# Patient Record
Sex: Female | Born: 1965 | Race: White | Hispanic: No | Marital: Single | Smoking: Never smoker
Health system: Southern US, Community
[De-identification: ages and names within clinical notes are randomized; demographics above are authoritative.]

## PROBLEM LIST (undated history)

## (undated) DIAGNOSIS — M1990 Unspecified osteoarthritis, unspecified site: Secondary | ICD-10-CM

## (undated) DIAGNOSIS — F32A Depression, unspecified: Secondary | ICD-10-CM

## (undated) DIAGNOSIS — F419 Anxiety disorder, unspecified: Secondary | ICD-10-CM

## (undated) DIAGNOSIS — Z8489 Family history of other specified conditions: Secondary | ICD-10-CM

## (undated) DIAGNOSIS — T8859XA Other complications of anesthesia, initial encounter: Secondary | ICD-10-CM

## (undated) DIAGNOSIS — N809 Endometriosis, unspecified: Secondary | ICD-10-CM

## (undated) HISTORY — PX: ENDOMETRIAL ABLATION: SHX621

## (undated) HISTORY — PX: WISDOM TOOTH EXTRACTION: SHX21

---

## 2007-08-25 DIAGNOSIS — I10 Essential (primary) hypertension: Secondary | ICD-10-CM

## 2007-08-25 HISTORY — PX: CHOLECYSTECTOMY: SHX55

## 2007-08-25 HISTORY — DX: Essential (primary) hypertension: I10

## 2008-08-24 DIAGNOSIS — J189 Pneumonia, unspecified organism: Secondary | ICD-10-CM

## 2008-08-24 HISTORY — DX: Pneumonia, unspecified organism: J18.9

## 2018-08-24 HISTORY — PX: ANTERIOR CERVICAL DECOMP/DISCECTOMY FUSION: SHX1161

## 2020-07-15 ENCOUNTER — Other Ambulatory Visit: Payer: Self-pay | Admitting: Neurological Surgery

## 2020-08-06 NOTE — Progress Notes (Signed)
[facility] #[id number] Lorenza Evangelist, [place] - [address] AT [facility] [address] [place] [address] [facility] [address] Phone: [phone] Fax: [phone]      Your procedure is scheduled on Friday December 17th, 2021.  Report to [facility] Main Entrance "A" at 09:15 A.M., and check in at the Admitting office.  Call this number if you have problems the morning of surgery:  [phone]  Call [phone] if you have any questions prior to your surgery date Monday-Friday 8am-4pm    Remember:  Do not eat or drink after midnight the night before your surgery    Take these medicines the morning of surgery with A SIP OF WATER: Cetirizine (Zyrtec) escitalopram (Lexapro) Gabapentin (Neurontin) Metoprolol tartrate (Lopressor)  If needed you may take the following medications the morning of surgery: Acetaminophen (Tylenol)  As of today, STOP taking any Diclofenac (Voltaren), Aspirin (unless otherwise instructed by your surgeon) Aleve, Naproxen, Ibuprofen, Motrin, Advil, Goody's, BC's, all herbal medications, fish oil, and all vitamins.                      Do not wear jewelry, make up, or nail polish            Do not wear lotions, powders, perfumes, or deodorant.            Do not shave 48 hours prior to surgery.  Men may shave face and neck.            Do not bring valuables to the hospital.            [facility] is not responsible for any belongings or valuables.  Do NOT Smoke (Tobacco/Vaping) or drink Alcohol 24 hours prior to your procedure  If you use a CPAP at night, you may bring all equipment for your overnight stay.   Contacts, glasses, dentures or bridgework may not be worn into surgery.      For patients admitted to the hospital, discharge time will be determined by your treatment team.   Patients discharged the day of surgery will not be allowed to drive home, and someone needs to stay with them for 24 hours.    Special instructions:   [place]-  Preparing For Surgery  Before surgery, you can play an important role. Because skin is not sterile, your skin needs to be as free of germs as possible. You can reduce the number of germs on your skin by washing with CHG (chlorahexidine gluconate) Soap before surgery.  CHG is an antiseptic cleaner which kills germs and bonds with the skin to continue killing germs even after washing.    Oral Hygiene is also important to reduce your risk of infection.  Remember - BRUSH YOUR TEETH THE MORNING OF SURGERY WITH YOUR REGULAR TOOTHPASTE  Please do not use if you have an allergy to CHG or antibacterial soaps. If your skin becomes reddened/irritated stop using the CHG.  Do not shave (including legs and underarms) for at least 48 hours prior to first CHG shower. It is OK to shave your face.  Please follow these instructions carefully.   1. Shower the NIGHT BEFORE SURGERY and the MORNING OF SURGERY with CHG Soap.   2. If you chose to wash your hair, wash your hair first as usual with your normal shampoo.  3. After you shampoo, rinse your hair and body thoroughly to remove the shampoo.  4. Use CHG as you would any  other liquid soap. You can apply CHG directly to the skin and wash gently with a scrungie or a clean washcloth.   5. Apply the CHG Soap to your body ONLY FROM THE NECK DOWN.  Do not use on open wounds or open sores. Avoid contact with your eyes, ears, mouth and genitals (private parts). Wash Face and genitals (private parts)  with your normal soap.   6. Wash thoroughly, paying special attention to the area where your surgery will be performed.  7. Thoroughly rinse your body with warm water from the neck down.  8. DO NOT shower/wash with your normal soap after using and rinsing off the CHG Soap.  9. Pat yourself dry with a CLEAN TOWEL.  10. Wear CLEAN PAJAMAS to bed the night before surgery  11. Place CLEAN SHEETS on your bed the night of your first shower and DO NOT SLEEP WITH  PETS.   Day of Surgery: Shower with CHG soap as directed Wear Clean/Comfortable clothing the morning of surgery Do not apply any deodorants/lotions.   Remember to brush your teeth WITH YOUR REGULAR TOOTHPASTE.   Please read over the following fact sheets that you were given.

## 2020-08-07 ENCOUNTER — Encounter (HOSPITAL_COMMUNITY)
Admission: RE | Admit: 2020-08-07 | Discharge: 2020-08-07 | Disposition: A | Discharge status: Home / Self Care | Payer: BC Managed Care – PPO | Source: Ambulatory Visit | Attending: Neurological Surgery | Admitting: Neurological Surgery

## 2020-08-07 ENCOUNTER — Other Ambulatory Visit (HOSPITAL_COMMUNITY)
Admission: RE | Admit: 2020-08-07 | Discharge: 2020-08-07 | Disposition: A | Discharge status: Home / Self Care | Payer: BC Managed Care – PPO | Source: Ambulatory Visit | Attending: Neurological Surgery | Admitting: Neurological Surgery

## 2020-08-07 ENCOUNTER — Ambulatory Visit (HOSPITAL_COMMUNITY)
Admission: RE | Admit: 2020-08-07 | Discharge: 2020-08-07 | Disposition: A | Discharge status: Home / Self Care | Payer: BC Managed Care – PPO | Source: Ambulatory Visit | Attending: Neurological Surgery | Admitting: Neurological Surgery

## 2020-08-07 ENCOUNTER — Encounter (HOSPITAL_COMMUNITY): Payer: Self-pay

## 2020-08-07 ENCOUNTER — Other Ambulatory Visit: Payer: Self-pay

## 2020-08-07 DIAGNOSIS — M4310 Spondylolisthesis, site unspecified: Secondary | ICD-10-CM | POA: Insufficient documentation

## 2020-08-07 DIAGNOSIS — Z01812 Encounter for preprocedural laboratory examination: Secondary | ICD-10-CM | POA: Insufficient documentation

## 2020-08-07 DIAGNOSIS — Z20822 Contact with and (suspected) exposure to covid-19: Secondary | ICD-10-CM | POA: Insufficient documentation

## 2020-08-07 HISTORY — DX: Unspecified osteoarthritis, unspecified site: M19.90

## 2020-08-07 HISTORY — DX: Endometriosis, unspecified: N80.9

## 2020-08-07 HISTORY — DX: Family history of other specified conditions: Z84.89

## 2020-08-07 HISTORY — DX: Depression, unspecified: F32.A

## 2020-08-07 HISTORY — DX: Anxiety disorder, unspecified: F41.9

## 2020-08-07 HISTORY — DX: Other complications of anesthesia, initial encounter: T88.59XA

## 2020-08-07 LAB — CBC WITH DIFFERENTIAL/PLATELET
Abs Immature Granulocytes: 0.02 10*3/uL (ref 0.00–0.07)
Basophils Absolute: 0 10*3/uL (ref 0.0–0.1)
Basophils Relative: 1 %
Eosinophils Absolute: 0.1 10*3/uL (ref 0.0–0.5)
Eosinophils Relative: 2 %
HCT: 42.2 % (ref 36.0–46.0)
Hemoglobin: 14.1 g/dL (ref 12.0–15.0)
Immature Granulocytes: 0 %
Lymphocytes Relative: 31 %
Lymphs Abs: 1.9 10*3/uL (ref 0.7–4.0)
MCH: 32.4 pg (ref 26.0–34.0)
MCHC: 33.4 g/dL (ref 30.0–36.0)
MCV: 97 fL (ref 80.0–100.0)
Monocytes Absolute: 0.6 10*3/uL (ref 0.1–1.0)
Monocytes Relative: 10 %
Neutro Abs: 3.6 10*3/uL (ref 1.7–7.7)
Neutrophils Relative %: 56 %
Platelets: 232 10*3/uL (ref 150–400)
RBC: 4.35 MIL/uL (ref 3.87–5.11)
RDW: 12.9 % (ref 11.5–15.5)
WBC: 6.3 10*3/uL (ref 4.0–10.5)
nRBC: 0 % (ref 0.0–0.2)

## 2020-08-07 LAB — PROTIME-INR
INR: 1 (ref 0.8–1.2)
Prothrombin Time: 12.7 seconds (ref 11.4–15.2)

## 2020-08-07 LAB — BASIC METABOLIC PANEL
Anion gap: 10 (ref 5–15)
BUN: 20 mg/dL (ref 6–20)
CO2: 27 mmol/L (ref 22–32)
Calcium: 9.5 mg/dL (ref 8.9–10.3)
Chloride: 102 mmol/L (ref 98–111)
Creatinine, Ser: 0.73 mg/dL (ref 0.44–1.00)
GFR, Estimated: >60 mL/min (ref >60)
Glucose, Bld: 107 mg/dL — ABNORMAL HIGH (ref 70–99)
Potassium: 3.7 mmol/L (ref 3.5–5.1)
Sodium: 139 mmol/L (ref 135–145)

## 2020-08-07 LAB — TYPE AND SCREEN
ABO/RH(D): A POS
Antibody Screen: NEGATIVE

## 2020-08-07 LAB — SARS CORONAVIRUS 2 (TAT 6-24 HRS): SARS Coronavirus 2: NEGATIVE

## 2020-08-07 LAB — SURGICAL PCR SCREEN
MRSA, PCR: NEGATIVE
Staphylococcus aureus: NEGATIVE

## 2020-08-07 NOTE — Progress Notes (Addendum)
PCP - Dr. Lisabeth Pick Cardiologist - Denies  Chest x-ray - 08-07-20 EKG - 08-07-20 Stress Test - Denies ECHO - Denies Cardiac Cath - Denies  Sleep Study - Denies OSA  DM - Denies  COVID TEST- 08-07-20  Anesthesia review:Yes EKG showed frequent PVC's  Patient denies shortness of breath, fever, cough and chest pain at PAT appointment   All instructions explained to the patient, with a verbal understanding of the material. Patient agrees to go over the instructions while at home for a better understanding. Patient also instructed to self quarantine after being tested for COVID-19. The opportunity to ask questions was provided.  Surgery time [phone]. Changed arrival time to 1000am

## 2020-08-07 NOTE — Progress Notes (Addendum)
Anesthesia PAT Evaluation:  Case: [id number] Date/Time: 08/09/20 1146   Procedure: PLIF - L4-L5 - L5-S1 (N/A Back)   Anesthesia type: General   Pre-op diagnosis: Spondylolisthesis   Location: [facility] OR ROOM 20 / [facility] OR   Surgeons: Tia Alert, MD      DISCUSSION: Patient is a 54 year old female scheduled for the above procedure.   History includes never smoker, HTN, anxiety, endometriosis (s/p endometrial ablation), C4-5 ACDF (05/2019), cholecystectomy (2009). BMI is consistent with morbid obesity.  For anesthesia history, reported itching on one occasion and maternal aunt "bottomed out during anesthesia" and cholecystectomy cancelled. She said they initially thought her aunt had an MI, but reported never saw a cardiologist and went on to have the cholecystectomy at a later date. She denied family history of malignant hyperthermia.   Patient evaluated at her PAT visit due to EKG showing frequent PVCs, asymptomatic. She denied chest pain, SOB, edema, palpitations, syncope. She does get occasional brief dizziness/"wozziness" which she attributes to waves of pain--last episode on 08/06/20. She denied known personal cardiac issues. Review of available EKG interpretations in [facility] do not show history of PVCs. Her mother who was a smoker died in her sleep from an MI at the age 71. Reportedly, her mother had had a recent colonoscopy and had not felt well afterwards.  Due to her family history, her PCP ordered a Coronary Calcium Score in 2019 which was 0. Patient reports she has had progressive back to toe pain since the summer. She was able to do swimming exercises this summer and is still able to do house cleaning/vacuuming without CV symptoms even now but just in pain. She does drink coffee or espresso twice a day, but denied sodas. She reduced alcohol intake in September and now only has an occasional wine, last was > 1 month ago.  Labs unremarkable with normal CBC and BMET with non-fasting  glucose of 107.   Frequent PVCs, new on EKG and of unclear signficance. She is overall asymptomatic as outlined above. Coronary calcium score 0 in 2019. No echo. She is morbidly obese but does not appear to have any acute HF symptoms (no SOB, no edema). Discussed with anesthesiologist Kipp Brood, MD. Given history and frequent PVCs on EKG with upcoming lumbar fusion, would recommend preoperative cardiology evaluation. I have discussed this with patient and have notified Erie Noe at Dr. Yetta Barre' office. Erie Noe will follow-up with patient. Patient also to contact her PCP.   08/07/20 preoperative COVID-19 test and CXR are still in process.    ADDENDUM 08/08/20 1:38 PM: CXR report finalized. Presurgical COVID-19 test negative.  Cardiology notes indicate she has received 2 doses of mRNA vaccine for COVID-19. Patient was evaluated by cardiologist Rinaldo Cloud, MD on 08/07/20 for preoperative evaluation given history and frequent PVCs on EKG.   He wrote, "Patient is at acceptable risk for the above surgery from cardiac point of view..." He recommended checking potassium and magnesium.  Potassium was 3.7 on 08/07/20--Magnesium added and was normal at 2.0. He did not order any additional cardiac testing. She is for urine pregnancy test on the day of surgery.   VS: BP 137/85   Pulse 78   Temp 36.5 C (Oral)   Resp 18   Ht 5\' 7"  (1.702 m)   Wt (!) 152.9 kg   SpO2 97%   BMI 52.78 kg/m  Heart rhythm regular with occasional ectopic beat (about every 6-10 beasts versus the every 3-4 seen on EKG). Lungs  clear.  No lower extremity pitting edema. No carotid bruits noted. Denied limited mouth opening. No conversational dyspnea.    PROVIDERS: Lisabeth Pick, MD is PCP [facility] Everywhere). Dr. Modesto Charon is in [place]. Patient recently moved from [place] to [place].    LABS: Labs reviewed: Acceptable for surgery. (all labs ordered are listed, but only abnormal results are displayed)  Labs Reviewed   BASIC METABOLIC PANEL - Abnormal; Notable for the following components:      Result Value   Glucose, Bld 107 (*)    All other components within normal limits  SURGICAL PCR SCREEN  CBC WITH DIFFERENTIAL/PLATELET  PROTIME-INR  TYPE AND SCREEN     IMAGES: CXR 08/07/20: FINDINGS: Lungs are clear. Heart size and pulmonary vascularity are normal. No adenopathy. There is lower thoracic dextroscoliosis. There is postoperative change in the lower cervical spine. IMPRESSION: Lungs clear.  Cardiac silhouette normal.   EKG: EKG 08/07/20: Sinus rhythm with frequent Premature ventricular complexes Possible Left atrial enlargement Borderline ECG  Multiple EKG done through [facility] Per Care Everywhere:  - 10/05/19: Sinus bradycardia. No ischemic changes. Normal EKG. - 12/16/11 & 12/20/12: NSR; NORMAL EKG - Reports note available in Care Everywhere for 01/17/14, 03/12/15, 09/02/16, 12/20/17   CV: CT Cardiac Calcium Scoring 12/24/17 ([facility]): IMPRESSION:   Total coronary calcium score is 0.    INTERPRETATION:   Coronary calcifications correlate directly to the amount of coronary plaque and to the risk of future coronary disease. Early detection and modification of risk factors can slow the progression of coronary artery disease. A low score suggests a low  likelihood of coronary artery disease, but does not exclude the possibility of significant coronary artery narrowing.   CORONARY ARTERY SCORE  TOTAL 0  Plaque burden:  No calcified plaque.  Recommendations: Maintain healthy diet and healthy lifestyle.    Past Medical History:  Diagnosis Date  . Anxiety   . Arthritis    BIL knees  . Complication of anesthesia    itching on one occasion  . Depression   . Endometriosis   . Family history of adverse reaction to anesthesia    maternal aunt "bottomed out during anesthesia" - surgery cancelled   . Hypertension 2009  . Pneumonia 2010    Past Surgical History:   Procedure Laterality Date  . ANTERIOR CERVICAL DECOMP/DISCECTOMY FUSION  2020   Dr. Yetta Barre  . CHOLECYSTECTOMY  2009  . ENDOMETRIAL ABLATION    . WISDOM TOOTH EXTRACTION Bilateral    upper and lower    MEDICATIONS: . acetaminophen (TYLENOL) 500 MG tablet  . cetirizine (ZYRTEC) 10 MG tablet  . diclofenac (VOLTAREN) 75 MG EC tablet  . escitalopram (LEXAPRO) 20 MG tablet  . gabapentin (NEURONTIN) 300 MG capsule  . losartan-hydrochlorothiazide (HYZAAR) 100-25 MG tablet  . lovastatin (MEVACOR) 10 MG tablet  . metoprolol tartrate (LOPRESSOR) 100 MG tablet   No current facility-administered medications for this encounter.     Shonna Chock, PA-C Surgical Short Stay/Anesthesiology [facility] Phone [phone] [facility] Phone [phone] 08/07/2020 3:14 PM

## 2020-08-08 LAB — MAGNESIUM: Magnesium: 2 mg/dL (ref 1.7–2.4)

## 2020-08-08 MED ORDER — DEXTROSE 5 % IV SOLN
3.0000 g | INTRAVENOUS | Status: DC
  Filled 2020-08-08: qty 3000

## 2020-08-08 NOTE — Anesthesia Preprocedure Evaluation (Addendum)
Anesthesia Evaluation  Patient identified by MRN, date of birth, ID band Patient awake    Reviewed: Allergy & Precautions, NPO status , Patient's Chart, lab work & pertinent test results, reviewed documented beta blocker date and time   Airway Mallampati: I  TM Distance: >3 FB Neck ROM: Full    Dental no notable dental hx. (+) Teeth Intact, Dental Advisory Given   Pulmonary neg pulmonary ROS,    Pulmonary exam normal breath sounds clear to auscultation       Cardiovascular hypertension, Pt. on medications and Pt. on home beta blockers Normal cardiovascular exam Rhythm:Regular Rate:Normal  EKG 08/07/20: Sinus rhythm with frequent Premature ventricular complexes Possible Left atrial enlargement Borderline ECG  Multiple EKG done through [facility]. Per Care Everywhere:  - 10/05/19: Sinus bradycardia. No ischemic changes. Normal EKG. - 12/16/11 & 12/20/12: NSR; NORMAL EKG - Reports note available in Care Everywhere for 01/17/14, 03/12/15, 09/02/16, 12/20/17  CT Cardiac Calcium Scoring 12/24/17 ([facility]): IMPRESSION:   Total coronary calcium score is 0.    Neuro/Psych PSYCHIATRIC DISORDERS Anxiety Depression negative neurological ROS     GI/Hepatic negative GI ROS, Neg liver ROS,   Endo/Other  Morbid obesity (BMI 53)  Renal/GU negative Renal ROS  negative genitourinary   Musculoskeletal  (+) Arthritis , S/p ACDF   Abdominal   Peds  Hematology negative hematology ROS (+)   Anesthesia Other Findings   Reproductive/Obstetrics                           Anesthesia Physical Anesthesia Plan  ASA: III  Anesthesia Plan: General   Post-op Pain Management:    Induction: Intravenous  PONV Risk Score and Plan: 3 and Midazolam, Dexamethasone and Ondansetron  Airway Management Planned: Oral ETT  Additional Equipment:   Intra-op Plan:   Post-operative Plan: Extubation in OR  Informed  Consent: I have reviewed the patients History and Physical, chart, labs and discussed the procedure including the risks, benefits and alternatives for the proposed anesthesia with the patient or authorized representative who has indicated his/her understanding and acceptance.     Dental advisory given  Plan Discussed with: CRNA  Anesthesia Plan Comments: (.  )       Anesthesia Quick Evaluation

## 2020-08-09 ENCOUNTER — Inpatient Hospital Stay (HOSPITAL_COMMUNITY): Payer: BC Managed Care – PPO | Admitting: Vascular Surgery

## 2020-08-09 ENCOUNTER — Other Ambulatory Visit: Payer: Self-pay

## 2020-08-09 ENCOUNTER — Inpatient Hospital Stay (HOSPITAL_COMMUNITY)
Admission: RE | Admit: 2020-08-09 | Discharge: 2020-08-10 | DRG: 454 | Disposition: A | Discharge status: Home / Self Care | Payer: BC Managed Care – PPO | Attending: Neurosurgery | Admitting: Neurosurgery

## 2020-08-09 ENCOUNTER — Inpatient Hospital Stay (HOSPITAL_COMMUNITY): Payer: BC Managed Care – PPO

## 2020-08-09 ENCOUNTER — Encounter (HOSPITAL_COMMUNITY)
Admission: RE | Disposition: A | Discharge status: Home / Self Care | Payer: Self-pay | Source: Home / Self Care | Attending: Neurological Surgery

## 2020-08-09 ENCOUNTER — Encounter (HOSPITAL_COMMUNITY): Payer: Self-pay | Admitting: Neurological Surgery

## 2020-08-09 DIAGNOSIS — Z6841 Body Mass Index (BMI) 40.0 and over, adult: Secondary | ICD-10-CM

## 2020-08-09 DIAGNOSIS — Z419 Encounter for procedure for purposes other than remedying health state, unspecified: Secondary | ICD-10-CM

## 2020-08-09 DIAGNOSIS — I10 Essential (primary) hypertension: Secondary | ICD-10-CM | POA: Diagnosis present

## 2020-08-09 DIAGNOSIS — Z20822 Contact with and (suspected) exposure to covid-19: Secondary | ICD-10-CM | POA: Diagnosis present

## 2020-08-09 DIAGNOSIS — Z880 Allergy status to penicillin: Secondary | ICD-10-CM | POA: Diagnosis not present

## 2020-08-09 DIAGNOSIS — R32 Unspecified urinary incontinence: Secondary | ICD-10-CM | POA: Diagnosis present

## 2020-08-09 DIAGNOSIS — Z79899 Other long term (current) drug therapy: Secondary | ICD-10-CM | POA: Diagnosis not present

## 2020-08-09 DIAGNOSIS — M48061 Spinal stenosis, lumbar region without neurogenic claudication: Secondary | ICD-10-CM | POA: Diagnosis present

## 2020-08-09 DIAGNOSIS — Z981 Arthrodesis status: Secondary | ICD-10-CM

## 2020-08-09 DIAGNOSIS — Z9049 Acquired absence of other specified parts of digestive tract: Secondary | ICD-10-CM

## 2020-08-09 DIAGNOSIS — M4316 Spondylolisthesis, lumbar region: Secondary | ICD-10-CM | POA: Diagnosis present

## 2020-08-09 DIAGNOSIS — M5127 Other intervertebral disc displacement, lumbosacral region: Secondary | ICD-10-CM | POA: Diagnosis present

## 2020-08-09 DIAGNOSIS — Z885 Allergy status to narcotic agent status: Secondary | ICD-10-CM

## 2020-08-09 LAB — POCT PREGNANCY, URINE: Preg Test, Ur: NEGATIVE

## 2020-08-09 LAB — ABO/RH: ABO/RH(D): A POS

## 2020-08-09 SURGERY — POSTERIOR LUMBAR FUSION 2 LEVEL
Anesthesia: General | Site: Spine Lumbar

## 2020-08-09 MED ORDER — ROCURONIUM BROMIDE 10 MG/ML (PF) SYRINGE
PREFILLED_SYRINGE | INTRAVENOUS | Status: DC | PRN
  Administered 2020-08-09: 60 mg via INTRAVENOUS
  Administered 2020-08-09: 40 mg via INTRAVENOUS
  Administered 2020-08-09: 20 mg via INTRAVENOUS

## 2020-08-09 MED ORDER — ONDANSETRON HCL 4 MG/2ML IJ SOLN
INTRAMUSCULAR | Status: AC
  Filled 2020-08-09: qty 2

## 2020-08-09 MED ORDER — THROMBIN 20000 UNITS EX SOLR
CUTANEOUS | Status: AC
  Filled 2020-08-09: qty 20000

## 2020-08-09 MED ORDER — ALBUMIN HUMAN 5 % IV SOLN: INTRAVENOUS | Status: DC | PRN

## 2020-08-09 MED ORDER — EPHEDRINE SULFATE-NACL 50-0.9 MG/10ML-% IV SOSY
PREFILLED_SYRINGE | INTRAVENOUS | Status: DC | PRN
  Administered 2020-08-09: 10 mg via INTRAVENOUS
  Administered 2020-08-09 (×3): 5 mg via INTRAVENOUS

## 2020-08-09 MED ORDER — CEFAZOLIN SODIUM-DEXTROSE 1-4 GM/50ML-% IV SOLN
INTRAVENOUS | Status: AC
  Filled 2020-08-09: qty 50

## 2020-08-09 MED ORDER — THROMBIN 5000 UNITS EX SOLR
OROMUCOSAL | Status: DC | PRN
  Administered 2020-08-09: 5 mL via TOPICAL

## 2020-08-09 MED ORDER — KETAMINE HCL 50 MG/5ML IJ SOSY
PREFILLED_SYRINGE | INTRAMUSCULAR | Status: AC
  Filled 2020-08-09: qty 5

## 2020-08-09 MED ORDER — DEXAMETHASONE SODIUM PHOSPHATE 10 MG/ML IJ SOLN
10.0000 mg | Freq: Once | INTRAMUSCULAR | Status: DC
  Filled 2020-08-09: qty 1

## 2020-08-09 MED ORDER — CHLORHEXIDINE GLUCONATE 0.12 % MT SOLN
15.0000 mL | Freq: Once | OROMUCOSAL | Status: AC
  Administered 2020-08-09: 15 mL via OROMUCOSAL
  Filled 2020-08-09: qty 15

## 2020-08-09 MED ORDER — OXYCODONE HCL 5 MG PO TABS
10.0000 mg | ORAL_TABLET | ORAL | Status: DC | PRN
  Administered 2020-08-09 – 2020-08-10 (×5): 10 mg via ORAL
  Filled 2020-08-09 (×5): qty 2

## 2020-08-09 MED ORDER — HYDROMORPHONE HCL 1 MG/ML IJ SOLN: 0.5000 mg | INTRAMUSCULAR | Status: DC | PRN

## 2020-08-09 MED ORDER — KETAMINE HCL 10 MG/ML IJ SOLN
INTRAMUSCULAR | Status: DC | PRN
  Administered 2020-08-09 (×2): 25 mg via INTRAVENOUS
  Administered 2020-08-09: 20 mg via INTRAVENOUS

## 2020-08-09 MED ORDER — LOSARTAN POTASSIUM-HCTZ 100-25 MG PO TABS: 1.0000 | ORAL_TABLET | Freq: Every day | ORAL | Status: DC

## 2020-08-09 MED ORDER — ACETAMINOPHEN 500 MG PO TABS
1000.0000 mg | ORAL_TABLET | ORAL | Status: AC
  Administered 2020-08-09: 1000 mg via ORAL
  Filled 2020-08-09: qty 2

## 2020-08-09 MED ORDER — SODIUM CHLORIDE 0.9 % IV SOLN
250.0000 mL | INTRAVENOUS | Status: DC
  Administered 2020-08-09: 250 mL via INTRAVENOUS

## 2020-08-09 MED ORDER — METHOCARBAMOL 500 MG PO TABS
500.0000 mg | ORAL_TABLET | Freq: Four times a day (QID) | ORAL | Status: DC | PRN
  Administered 2020-08-09 – 2020-08-10 (×2): 500 mg via ORAL
  Filled 2020-08-09 (×2): qty 1

## 2020-08-09 MED ORDER — MIDAZOLAM HCL 5 MG/5ML IJ SOLN
INTRAMUSCULAR | Status: DC | PRN
  Administered 2020-08-09: 2 mg via INTRAVENOUS

## 2020-08-09 MED ORDER — ESCITALOPRAM OXALATE 20 MG PO TABS
20.0000 mg | ORAL_TABLET | Freq: Every day | ORAL | Status: DC
Start: 2020-08-09 — End: 2020-08-10
  Filled 2020-08-09 (×2): qty 1

## 2020-08-09 MED ORDER — ACETAMINOPHEN 650 MG RE SUPP
650.0000 mg | RECTAL | Status: DC | PRN
Start: 2020-08-09 — End: 2020-08-10

## 2020-08-09 MED ORDER — GLYCOPYRROLATE PF 0.2 MG/ML IJ SOSY
PREFILLED_SYRINGE | INTRAMUSCULAR | Status: AC
  Filled 2020-08-09: qty 1

## 2020-08-09 MED ORDER — GABAPENTIN 300 MG PO CAPS
600.0000 mg | ORAL_CAPSULE | Freq: Three times a day (TID) | ORAL | Status: DC
  Administered 2020-08-09 – 2020-08-10 (×2): 600 mg via ORAL
  Filled 2020-08-09 (×2): qty 2

## 2020-08-09 MED ORDER — LIDOCAINE 2% (20 MG/ML) 5 ML SYRINGE
INTRAMUSCULAR | Status: DC | PRN
  Administered 2020-08-09: 100 mg via INTRAVENOUS

## 2020-08-09 MED ORDER — FENTANYL CITRATE (PF) 250 MCG/5ML IJ SOLN
INTRAMUSCULAR | Status: AC
  Filled 2020-08-09: qty 5

## 2020-08-09 MED ORDER — ONDANSETRON HCL 4 MG PO TABS: 4.0000 mg | ORAL_TABLET | Freq: Four times a day (QID) | ORAL | Status: DC | PRN

## 2020-08-09 MED ORDER — GABAPENTIN 300 MG PO CAPS
300.0000 mg | ORAL_CAPSULE | ORAL | Status: DC
  Filled 2020-08-09: qty 1

## 2020-08-09 MED ORDER — CHLORHEXIDINE GLUCONATE CLOTH 2 % EX PADS: 6.0000 | MEDICATED_PAD | Freq: Once | CUTANEOUS | Status: DC

## 2020-08-09 MED ORDER — SENNA 8.6 MG PO TABS
1.0000 | ORAL_TABLET | Freq: Two times a day (BID) | ORAL | Status: DC
  Administered 2020-08-09: 8.6 mg via ORAL
  Filled 2020-08-09: qty 1

## 2020-08-09 MED ORDER — POTASSIUM CHLORIDE IN NACL 20-0.9 MEQ/L-% IV SOLN: INTRAVENOUS | Status: DC

## 2020-08-09 MED ORDER — DEXTROSE 5 % IV SOLN
INTRAVENOUS | Status: DC | PRN
  Administered 2020-08-09: 3 g via INTRAVENOUS

## 2020-08-09 MED ORDER — SODIUM CHLORIDE 0.9% FLUSH: 3.0000 mL | INTRAVENOUS | Status: DC | PRN

## 2020-08-09 MED ORDER — FENTANYL CITRATE (PF) 100 MCG/2ML IJ SOLN
INTRAMUSCULAR | Status: DC | PRN
  Administered 2020-08-09 (×2): 50 ug via INTRAVENOUS
  Administered 2020-08-09: 100 ug via INTRAVENOUS
  Administered 2020-08-09: 50 ug via INTRAVENOUS

## 2020-08-09 MED ORDER — GLYCOPYRROLATE PF 0.2 MG/ML IJ SOSY
PREFILLED_SYRINGE | INTRAMUSCULAR | Status: DC | PRN
Start: 2020-08-09 — End: 2020-08-09
  Administered 2020-08-09: .2 mg via INTRAVENOUS

## 2020-08-09 MED ORDER — SUGAMMADEX SODIUM 200 MG/2ML IV SOLN
INTRAVENOUS | Status: DC | PRN
  Administered 2020-08-09: 400 mg via INTRAVENOUS

## 2020-08-09 MED ORDER — PROPOFOL 10 MG/ML IV BOLUS
INTRAVENOUS | Status: DC | PRN
  Administered 2020-08-09: 250 mg via INTRAVENOUS

## 2020-08-09 MED ORDER — FENTANYL CITRATE (PF) 100 MCG/2ML IJ SOLN
INTRAMUSCULAR | Status: AC
  Filled 2020-08-09: qty 2

## 2020-08-09 MED ORDER — METOPROLOL TARTRATE 25 MG PO TABS
100.0000 mg | ORAL_TABLET | Freq: Every day | ORAL | Status: DC
  Administered 2020-08-09: 100 mg via ORAL
  Filled 2020-08-09: qty 4

## 2020-08-09 MED ORDER — VANCOMYCIN HCL 1000 MG IV SOLR
INTRAVENOUS | Status: AC
  Filled 2020-08-09: qty 1000

## 2020-08-09 MED ORDER — SODIUM CHLORIDE 0.9% FLUSH: 3.0000 mL | Freq: Two times a day (BID) | INTRAVENOUS | Status: DC

## 2020-08-09 MED ORDER — FENTANYL CITRATE (PF) 100 MCG/2ML IJ SOLN
25.0000 ug | INTRAMUSCULAR | Status: DC | PRN
Start: 2020-08-09 — End: 2020-08-09
  Administered 2020-08-09 (×2): 50 ug via INTRAVENOUS

## 2020-08-09 MED ORDER — BUPIVACAINE HCL (PF) 0.25 % IJ SOLN
INTRAMUSCULAR | Status: AC
  Filled 2020-08-09: qty 30

## 2020-08-09 MED ORDER — ONDANSETRON HCL 4 MG/2ML IJ SOLN
INTRAMUSCULAR | Status: DC | PRN
  Administered 2020-08-09: 4 mg via INTRAVENOUS

## 2020-08-09 MED ORDER — ROCURONIUM BROMIDE 10 MG/ML (PF) SYRINGE
PREFILLED_SYRINGE | INTRAVENOUS | Status: AC
  Filled 2020-08-09: qty 20

## 2020-08-09 MED ORDER — DEXAMETHASONE SODIUM PHOSPHATE 10 MG/ML IJ SOLN
INTRAMUSCULAR | Status: DC | PRN
  Administered 2020-08-09: 10 mg via INTRAVENOUS

## 2020-08-09 MED ORDER — LIDOCAINE 2% (20 MG/ML) 5 ML SYRINGE
INTRAMUSCULAR | Status: AC
  Filled 2020-08-09: qty 5

## 2020-08-09 MED ORDER — PHENOL 1.4 % MT LIQD: 1.0000 | OROMUCOSAL | Status: DC | PRN

## 2020-08-09 MED ORDER — PHENYLEPHRINE 40 MCG/ML (10ML) SYRINGE FOR IV PUSH (FOR BLOOD PRESSURE SUPPORT)
PREFILLED_SYRINGE | INTRAVENOUS | Status: AC
  Filled 2020-08-09: qty 10

## 2020-08-09 MED ORDER — LACTATED RINGERS IV SOLN: INTRAVENOUS | Status: DC

## 2020-08-09 MED ORDER — PHENYLEPHRINE 40 MCG/ML (10ML) SYRINGE FOR IV PUSH (FOR BLOOD PRESSURE SUPPORT)
PREFILLED_SYRINGE | INTRAVENOUS | Status: DC | PRN
  Administered 2020-08-09: 40 ug via INTRAVENOUS
  Administered 2020-08-09 (×2): 80 ug via INTRAVENOUS
  Administered 2020-08-09 (×2): 40 ug via INTRAVENOUS

## 2020-08-09 MED ORDER — CEFAZOLIN SODIUM-DEXTROSE 2-4 GM/100ML-% IV SOLN
2.0000 g | Freq: Three times a day (TID) | INTRAVENOUS | Status: AC
  Administered 2020-08-09 – 2020-08-10 (×2): 2 g via INTRAVENOUS
  Filled 2020-08-09 (×2): qty 100

## 2020-08-09 MED ORDER — THROMBIN 20000 UNITS EX SOLR
CUTANEOUS | Status: DC | PRN
  Administered 2020-08-09: 20 mL via TOPICAL

## 2020-08-09 MED ORDER — ACETAMINOPHEN 325 MG PO TABS
650.0000 mg | ORAL_TABLET | ORAL | Status: DC | PRN
  Administered 2020-08-09 – 2020-08-10 (×2): 650 mg via ORAL
  Filled 2020-08-09 (×2): qty 2

## 2020-08-09 MED ORDER — 0.9 % SODIUM CHLORIDE (POUR BTL) OPTIME
TOPICAL | Status: DC | PRN
  Administered 2020-08-09 (×2): 1000 mL

## 2020-08-09 MED ORDER — LOSARTAN POTASSIUM 50 MG PO TABS: 100.0000 mg | ORAL_TABLET | Freq: Every day | ORAL | Status: DC

## 2020-08-09 MED ORDER — EPHEDRINE 5 MG/ML INJ
INTRAVENOUS | Status: AC
  Filled 2020-08-09: qty 10

## 2020-08-09 MED ORDER — HYDROCHLOROTHIAZIDE 25 MG PO TABS: 25.0000 mg | ORAL_TABLET | Freq: Every day | ORAL | Status: DC

## 2020-08-09 MED ORDER — CEFAZOLIN SODIUM-DEXTROSE 2-4 GM/100ML-% IV SOLN
INTRAVENOUS | Status: AC
  Filled 2020-08-09: qty 100

## 2020-08-09 MED ORDER — MENTHOL 3 MG MT LOZG: 1.0000 | LOZENGE | OROMUCOSAL | Status: DC | PRN

## 2020-08-09 MED ORDER — PHENYLEPHRINE HCL-NACL 10-0.9 MG/250ML-% IV SOLN
INTRAVENOUS | Status: DC | PRN
  Administered 2020-08-09: 50 ug/min via INTRAVENOUS

## 2020-08-09 MED ORDER — CELECOXIB 200 MG PO CAPS
200.0000 mg | ORAL_CAPSULE | Freq: Two times a day (BID) | ORAL | Status: DC
  Administered 2020-08-09 – 2020-08-10 (×2): 200 mg via ORAL
  Filled 2020-08-09 (×2): qty 1

## 2020-08-09 MED ORDER — THROMBIN 5000 UNITS EX SOLR
CUTANEOUS | Status: AC
  Filled 2020-08-09: qty 5000

## 2020-08-09 MED ORDER — ORAL CARE MOUTH RINSE: 15.0000 mL | Freq: Once | OROMUCOSAL | Status: AC

## 2020-08-09 MED ORDER — MIDAZOLAM HCL 2 MG/2ML IJ SOLN
INTRAMUSCULAR | Status: AC
  Filled 2020-08-09: qty 2

## 2020-08-09 MED ORDER — BUPIVACAINE HCL (PF) 0.25 % IJ SOLN
INTRAMUSCULAR | Status: DC | PRN
  Administered 2020-08-09: 5 mL

## 2020-08-09 MED ORDER — METHOCARBAMOL 1000 MG/10ML IJ SOLN
500.0000 mg | Freq: Four times a day (QID) | INTRAVENOUS | Status: DC | PRN
  Filled 2020-08-09: qty 5

## 2020-08-09 MED ORDER — VANCOMYCIN HCL 1000 MG IV SOLR: INTRAVENOUS | Status: DC | PRN

## 2020-08-09 MED ORDER — PROPOFOL 10 MG/ML IV BOLUS
INTRAVENOUS | Status: AC
  Filled 2020-08-09: qty 20

## 2020-08-09 MED ORDER — DEXAMETHASONE SODIUM PHOSPHATE 10 MG/ML IJ SOLN
INTRAMUSCULAR | Status: AC
  Filled 2020-08-09: qty 1

## 2020-08-09 MED ORDER — ONDANSETRON HCL 4 MG/2ML IJ SOLN: 4.0000 mg | Freq: Four times a day (QID) | INTRAMUSCULAR | Status: DC | PRN

## 2020-08-09 SURGICAL SUPPLY — 73 items
BASKET BONE COLLECTION (BASKET) ×2 IMPLANT
BENZOIN TINCTURE PRP APPL 2/3 (GAUZE/BANDAGES/DRESSINGS) ×2 IMPLANT
BLADE CLIPPER SURG (BLADE) IMPLANT
BUR CARBIDE MATCH 3.0 (BURR) ×2 IMPLANT
CANISTER SUCT 3000ML PPV (MISCELLANEOUS) ×2 IMPLANT
CNTNR URN SCR LID CUP LEK RST (MISCELLANEOUS) ×1 IMPLANT
CONT SPEC 4OZ STRL OR WHT (MISCELLANEOUS) ×1
COVER BACK TABLE 60X90IN (DRAPES) ×2 IMPLANT
COVER WAND RF STERILE (DRAPES) IMPLANT
DERMABOND ADVANCED (GAUZE/BANDAGES/DRESSINGS) ×1
DERMABOND ADVANCED .7 DNX12 (GAUZE/BANDAGES/DRESSINGS) ×1 IMPLANT
DIFFUSER DRILL AIR PNEUMATIC (MISCELLANEOUS) IMPLANT
DRAPE C-ARM 42X72 X-RAY (DRAPES) ×2 IMPLANT
DRAPE C-ARMOR (DRAPES) ×2 IMPLANT
DRAPE LAPAROTOMY 100X72X124 (DRAPES) ×2 IMPLANT
DRAPE SURG 17X23 STRL (DRAPES) ×2 IMPLANT
DRSG OPSITE 4X5.5 SM (GAUZE/BANDAGES/DRESSINGS) ×2 IMPLANT
DRSG OPSITE POSTOP 4X8 (GAUZE/BANDAGES/DRESSINGS) ×2 IMPLANT
DURAPREP 26ML APPLICATOR (WOUND CARE) ×2 IMPLANT
ELECT BLADE 4.0 EZ CLEAN MEGAD (MISCELLANEOUS) ×2
ELECT REM PT RETURN 9FT ADLT (ELECTROSURGICAL) ×2
ELECTRODE BLDE 4.0 EZ CLN MEGD (MISCELLANEOUS) ×1 IMPLANT
ELECTRODE REM PT RTRN 9FT ADLT (ELECTROSURGICAL) ×1 IMPLANT
EVACUATOR 1/8 PVC DRAIN (DRAIN) ×2 IMPLANT
GAUZE 4X4 16PLY RFD (DISPOSABLE) IMPLANT
GAUZE SPONGE 4X4 12PLY STRL LF (GAUZE/BANDAGES/DRESSINGS) ×2 IMPLANT
GLOVE BIO SURGEON STRL SZ7 (GLOVE) ×4 IMPLANT
GLOVE BIO SURGEON STRL SZ8 (GLOVE) ×4 IMPLANT
GLOVE BIOGEL PI IND STRL 7.0 (GLOVE) IMPLANT
GLOVE BIOGEL PI IND STRL 7.5 (GLOVE) ×2 IMPLANT
GLOVE BIOGEL PI IND STRL 8.5 (GLOVE) ×1 IMPLANT
GLOVE BIOGEL PI INDICATOR 7.0 (GLOVE)
GLOVE BIOGEL PI INDICATOR 7.5 (GLOVE) ×2
GLOVE BIOGEL PI INDICATOR 8.5 (GLOVE) ×1
GLOVE SURG SS PI 6.0 STRL IVOR (GLOVE) ×12 IMPLANT
GLOVE SURG SS PI 6.5 STRL IVOR (GLOVE) ×4 IMPLANT
GLOVE SURG SS PI 7.0 STRL IVOR (GLOVE) ×4 IMPLANT
GLOVE SURG UNDER POLY LF SZ6.5 (GLOVE) ×6 IMPLANT
GLOVE SURG UNDER POLY LF SZ7 (GLOVE) ×6 IMPLANT
GOWN STRL REUS W/ TWL LRG LVL3 (GOWN DISPOSABLE) ×7 IMPLANT
GOWN STRL REUS W/ TWL XL LVL3 (GOWN DISPOSABLE) ×2 IMPLANT
GOWN STRL REUS W/TWL 2XL LVL3 (GOWN DISPOSABLE) IMPLANT
GOWN STRL REUS W/TWL LRG LVL3 (GOWN DISPOSABLE) ×7
GOWN STRL REUS W/TWL XL LVL3 (GOWN DISPOSABLE) ×2
HEMOSTAT POWDER KIT SURGIFOAM (HEMOSTASIS) ×2 IMPLANT
KIT BASIN OR (CUSTOM PROCEDURE TRAY) ×2 IMPLANT
KIT INFUSE X SMALL 1.4CC (Orthopedic Implant) ×2 IMPLANT
KIT TURNOVER KIT B (KITS) ×2 IMPLANT
MATRIX STRIP NEOCORE 12C (Putty) ×1 IMPLANT
MILL MEDIUM DISP (BLADE) IMPLANT
NEEDLE HYPO 25X1 1.5 SAFETY (NEEDLE) ×2 IMPLANT
NS IRRIG 1000ML POUR BTL (IV SOLUTION) ×4 IMPLANT
PACK LAMINECTOMY NEURO (CUSTOM PROCEDURE TRAY) ×2 IMPLANT
PAD ARMBOARD 7.5X6 YLW CONV (MISCELLANEOUS) ×14 IMPLANT
ROD LORD LIPPED TI 5.5X35 (Rod) ×4 IMPLANT
SCREW CANC SHANK MOD 6.5X45 (Screw) ×8 IMPLANT
SCREW POLYAXIAL TULIP (Screw) ×8 IMPLANT
SET SCREW (Screw) ×4 IMPLANT
SET SCREW SPNE (Screw) ×4 IMPLANT
SPACER IDENTITI PS 9X9X25 10D (Spacer) ×4 IMPLANT
SPONGE LAP 4X18 RFD (DISPOSABLE) IMPLANT
SPONGE SURGIFOAM ABS GEL 100 (HEMOSTASIS) ×2 IMPLANT
STRIP CLOSURE SKIN 1/2X4 (GAUZE/BANDAGES/DRESSINGS) ×2 IMPLANT
STRIP MATRIX NEOCORE 12CC (Putty) ×1 IMPLANT
SUT VIC AB 0 CT1 18XCR BRD8 (SUTURE) ×2 IMPLANT
SUT VIC AB 0 CT1 8-18 (SUTURE) ×2
SUT VIC AB 2-0 CP2 18 (SUTURE) ×2 IMPLANT
SUT VIC AB 3-0 SH 8-18 (SUTURE) ×4 IMPLANT
SYR CONTROL 10ML LL (SYRINGE) ×2 IMPLANT
TOWEL GREEN STERILE (TOWEL DISPOSABLE) ×2 IMPLANT
TOWEL GREEN STERILE FF (TOWEL DISPOSABLE) ×2 IMPLANT
TRAY FOLEY MTR SLVR 16FR STAT (SET/KITS/TRAYS/PACK) ×2 IMPLANT
WATER STERILE IRR 1000ML POUR (IV SOLUTION) ×2 IMPLANT

## 2020-08-09 NOTE — H&P (Signed)
Subjective: Patient is a 54 y.o. female admitted for plif. Onset of symptoms was several months ago, gradually worsening since that time.  The pain is rated severe, and is located at the across the lower back and radiates to legs L>R. The pain is described as aching and sharp and occurs all day. The symptoms have been progressive. Symptoms are exacerbated by exercise and standing. MRI or CT showed spondylolisthesis L4-5, HNP L5-S1 L   Past Medical History:  Diagnosis Date  . Anxiety   . Arthritis    BIL knees  . Complication of anesthesia    itching on one occasion  . Depression   . Endometriosis   . Family history of adverse reaction to anesthesia    maternal aunt "bottomed out during anesthesia" - surgery cancelled   . Hypertension 2009  . Pneumonia 2010    Past Surgical History:  Procedure Laterality Date  . ANTERIOR CERVICAL DECOMP/DISCECTOMY FUSION  2020   Dr. Yetta Barre  . CHOLECYSTECTOMY  2009  . ENDOMETRIAL ABLATION    . WISDOM TOOTH EXTRACTION Bilateral    upper and lower    Prior to Admission medications   Medication Sig Start Date End Date Taking? Authorizing Provider  acetaminophen (TYLENOL) 500 MG tablet Take 1,000 mg by mouth in the morning and at bedtime.   Yes [provider]  cetirizine (ZYRTEC) 10 MG tablet Take 10 mg by mouth daily.   Yes [provider]  diclofenac (VOLTAREN) 75 MG EC tablet Take 75 mg by mouth 2 (two) times daily.   Yes [provider]  escitalopram (LEXAPRO) 20 MG tablet Take 20 mg by mouth daily.   Yes [provider]  gabapentin (NEURONTIN) 300 MG capsule Take 600 mg by mouth 3 (three) times daily.   Yes [provider]  losartan-hydrochlorothiazide (HYZAAR) 100-25 MG tablet Take 1 tablet by mouth daily.   Yes [provider]  lovastatin (MEVACOR) 10 MG tablet Take 10 mg by mouth at bedtime.   Yes [provider]  metoprolol tartrate (LOPRESSOR) 100 MG tablet Take 100 mg by mouth  daily.   Yes [provider]   Allergies  Allergen Reactions  . Codeine Hives  . Penicillins Hives    Hives around injection site     Social History   Tobacco Use  . Smoking status: Never Smoker  . Smokeless tobacco: Never Used  Substance Use Topics  . Alcohol use: Not Currently    Comment: social drinker     History reviewed. No pertinent family history.   Review of Systems  Positive ROS: neg  All other systems have been reviewed and were otherwise negative with the exception of those mentioned in the HPI and as above.  Objective: Vital signs in last 24 hours:    General Appearance: Alert, cooperative, no distress, appears stated age Head: Normocephalic, without obvious abnormality, atraumatic Eyes: PERRL, conjunctiva/corneas clear, EOM's intact    Neck: Supple, symmetrical, trachea midline Back: Symmetric, no curvature, ROM normal, no CVA tenderness Lungs:  respirations unlabored Heart: Regular rate and rhythm Abdomen: Soft, non-tender Extremities: Extremities normal, atraumatic, no cyanosis or edema Pulses: 2+ and symmetric all extremities Skin: Skin color, texture, turgor normal, no rashes or lesions  NEUROLOGIC:   Mental status: Alert and oriented x4,  no aphasia, good attention span, fund of knowledge, and memory Motor Exam - grossly normal Sensory Exam - grossly normal Reflexes: 1+ Coordination - grossly normal Gait - grossly normal Balance - grossly normal Cranial Nerves:  I: smell Not tested  II: visual acuity  OS: nl    OD: nl  II: visual fields Full to confrontation  II: pupils Equal, round, reactive to light  III,VII: ptosis None  III,IV,VI: extraocular muscles  Full ROM  V: mastication Normal  V: facial light touch sensation  Normal  V,VII: corneal reflex  Present  VII: facial muscle function - upper  Normal  VII: facial muscle function - lower Normal  VIII: hearing Not tested  IX: soft palate elevation  Normal  IX,X: gag reflex  Present  XI: trapezius strength  5/5  XI: sternocleidomastoid strength 5/5  XI: neck flexion strength  5/5  XII: tongue strength  Normal    Data Review Lab Results  Component Value Date   WBC 6.3 08/07/2020   HGB 14.1 08/07/2020   HCT 42.2 08/07/2020   MCV 97.0 08/07/2020   PLT 232 08/07/2020   Lab Results  Component Value Date   NA 139 08/07/2020   K 3.7 08/07/2020   CL 102 08/07/2020   CO2 27 08/07/2020   BUN 20 08/07/2020   CREATININE 0.73 08/07/2020   GLUCOSE 107 (H) 08/07/2020   Lab Results  Component Value Date   INR 1.0 08/07/2020    Assessment/Plan:  Estimated body mass index is 52.78 kg/m as calculated from the following:   Height as of 08/07/20: 5\' 7"  (1.702 m).   Weight as of 08/07/20: 152.9 kg. Patient admitted for PLIF L4-5, PLIF vs L microdisk L5-S1. Patient has failed a reasonable attempt at conservative therapy.  I explained the condition and procedure to the patient and answered any questions.  Patient wishes to proceed with procedure as planned. Understands risks/ benefits and typical outcomes of procedure.   08/09/20 08/09/2020 10:33 AM

## 2020-08-09 NOTE — Op Note (Signed)
08/09/2020  2:59 PM  PATIENT:  Alejandra White  54 y.o. female  PRE-OPERATIVE DIAGNOSIS: Dynamic degenerative spondylolisthesis with spinal stenosis L4-5, left L5-S1 herniated disc, back and leg pain  POST-OPERATIVE DIAGNOSIS:  same  PROCEDURE:   1. Decompressive lumbar laminectomy hemifacetectomy foraminotomies L4-5 requiring more work than would be required for a simple exposure of the disk for PLIF in order to adequately decompress the neural elements and address the spinal stenosis 2. Posterior lumbar interbody fusion L4-5 using PTI interbody cages packed with morcellized allograft and autograft  3. Posterior fixation L4-5 using Alphatec cortical pedicle screws.  4. Intertransverse arthrodesis L4-5 using morcellized autograft and allograft. 5.  Left L5-S1 hemilaminectomy medial facetectomy and foraminotomies for lateral recess decompression followed by discectomy  SURGEON:  Marikay Alar, MD  ASSISTANTS: Dr. Danielle Dess  ANESTHESIA:  General  EBL: 300 ml  Total I/O In: 1250 [I.V.:1000; IV Piggyback:250] Out: 600 [Urine:300; Blood:300]  BLOOD ADMINISTERED:none  DRAINS: none   INDICATION FOR PROCEDURE: This patient presented with severe back and leg pain with urinary incontinence. Imaging revealed difficult dynamic spondylolisthesis with stenosis L4-5 and a left L5-S1 disc herniation. The patient tried a reasonable attempt at conservative medical measures without relief. I recommended decompression and instrumented fusion to address the stenosis as well as the segmental  instability.  Patient understood the risks, benefits, and alternatives and potential outcomes and wished to proceed.  Findings at surgery: Her morbid obesity made the surgery much more difficult than is typical for this surgery.  Retraction of the anatomy was much more difficult and the retractors were of  inadequate size, much more work time was required to fully identify the anatomical elements and the correct  level, the depth made the technical aspects of the decompression and discectomy much more difficult and time-consuming.  PROCEDURE DETAILS:  The patient was brought to the operating room. After induction of generalized endotracheal anesthesia the patient was rolled into the prone position on chest rolls and all pressure points were padded. The patient's lumbar region was cleaned and then prepped with DuraPrep and draped in the usual sterile fashion. Anesthesia was injected and then a dorsal midline incision was made and carried down to the lumbosacral fascia.  The fascia was about 80 to 90 cm deep and retractors were placed to expose the fascia.  The fascia was opened and the paraspinous musculature was taken down in a subperiosteal fashion to expose L4-5 and L5-S1. A self-retaining retractor was placed.  Firming our level was quite difficult because it was difficult to get adequate fluoroscopic pictures but we used AP and lateral fluoroscopy and all in the room agreed that we were at L4-5 and L5-S1 throughout the case.  We confirmed this multiple times.  Intraoperative fluoroscopy confirmed my level, and I started the left L5-S1 hemilaminectomy medial facetectomy and foraminotomies.  The yellow ligament was opened and removed to expose the underlying dura and exiting S1 nerve root.  I dissected down to his medial to the pedicle.  Identified the disc and there was a subannular protrusion.  I incised the annulus and performed a thorough discectomy.  When I was done the annulus was flat.  The nerve appeared to be free.  We decided against fusion at this level because of the technical difficulties of the surgery.  We thought this would increase the risk of not only the technical aspects of the decompression and fusion but the risk of pseudoarthrosis.  Therefore we turned our attention to the placement of  the L4 cortical pedicle screws. The pedicle screw entry zones were identified utilizing surface landmarks and  AP  and lateral fluoroscopy. I scored the cortex with the high-speed drill and then used the hand drill to drill an upward and outward direction into the pedicle. I then tapped line to line. I then placed a 6.5 x 45 mm cortical pedicle screw into the pedicles of L4 bilaterally.  Dr. Danielle Dess assisted with the placement of the pedicle screws and the localization of the levels.  I then turned my attention to the decompression and complete lumbar laminectomies, hemi- facetectomies, and foraminotomies were performed at L4-5 bilaterally.  My nurse practitioner was directly involved in the decompression and exposure of the neural elements. the patient had significant spinal stenosis and this required more work than would be required for a simple exposure of the disc for posterior lumbar interbody fusion which would only require a limited laminotomy. Much more generous decompression and generous foraminotomy was undertaken in order to adequately decompress the neural elements and address the patient's leg pain. The yellow ligament was removed to expose the underlying dura and nerve roots, and generous foraminotomies were performed to adequately decompress the neural elements. Both the exiting and traversing nerve roots were decompressed on both sides until a coronary dilator passed easily along the nerve roots. Once the decompression was complete, I turned my attention to the posterior lower lumbar interbody fusion. The epidural venous vasculature was coagulated and cut sharply. Disc space was incised and the initial discectomy was performed with pituitary rongeurs. The disc space was distracted with sequential distractors to a height of 9 mm. We then used a series of scrapers and shavers to prepare the endplates for fusion. The midline was prepared with Epstein curettes. Once the complete discectomy was finished, we packed an appropriate sized interbody cage with local autograft and morcellized allograft, gently retracted the  nerve root, and tapped the cage into position at L4-5.  The midline between the cages was packed with morselized autograft and allograft. We then turned our attention to the placement of the lower pedicle screws. The pedicle screw entry zones were identified utilizing surface landmarks and fluoroscopy. I drilled into each pedicle utilizing the hand drill, and tapped each pedicle with the appropriate tap. We palpated with a ball probe to assure no break in the cortex. We then placed 6.5 x 45 mm pedicle screws into the pedicles bilaterally at L5.  My nurse practitioner assisted in placement of the pedicle screws.  We then decorticated the transverse processes and laid a mixture of morcellized autograft and allograft out over these to perform intertransverse arthrodesis at L4-5. We then placed lordotic rods into the multiaxial screw heads of the pedicle screws and locked these in position with the locking caps and anti-torque device. We then checked our construct with AP and lateral fluoroscopy. Irrigated with copious amounts of bacitracin-containing saline solution. Inspected the nerve roots once again to assure adequate decompression, lined to the dura with Gelfoam,  and then we closed the muscle and the fascia with 0 Vicryl. Closed the subcutaneous tissues with 2-0 Vicryl and subcuticular tissues with 3-0 Vicryl. The skin was closed with benzoin and Steri-Strips. Dressing was then applied, the patient was awakened from general anesthesia and transported to the recovery room in stable condition. At the end of the procedure all sponge, needle and instrument counts were correct.   PLAN OF CARE: admit to inpatient  PATIENT DISPOSITION:  PACU - hemodynamically stable.   Delay  start of Pharmacological VTE agent (>24hrs) due to surgical blood loss or risk of bleeding:  yes

## 2020-08-09 NOTE — Anesthesia Procedure Notes (Signed)
Procedure Name: Intubation Date/Time: 08/09/2020 11:16 AM Performed by: Janene Harvey, CRNA Pre-anesthesia Checklist: Patient identified, Emergency Drugs available, Suction available and Patient being monitored Patient Re-evaluated:Patient Re-evaluated prior to induction Oxygen Delivery Method: Circle system utilized Preoxygenation: Pre-oxygenation with 100% oxygen Induction Type: IV induction Ventilation: Mask ventilation without difficulty Laryngoscope Size: Mac and 4 Grade View: Grade I Tube type: Oral Tube size: 7.5 mm Number of attempts: 1 Airway Equipment and Method: Stylet and Oral airway Placement Confirmation: ETT inserted through vocal cords under direct vision,  positive ETCO2 and breath sounds checked- equal and bilateral Secured at: 22 cm Tube secured with: Tape Dental Injury: Teeth and Oropharynx as per pre-operative assessment

## 2020-08-09 NOTE — Transfer of Care (Signed)
Immediate Anesthesia Transfer of Care Note  Patient: Alejandra White  Procedure(s) Performed: POSTERIOR LUMBAR INTERBODY FUSION LUMBAR FOUR-FIVE; LEFT LUMBAR FIVE-SACARAL ONE MICRODISCECTOMY (N/A Spine Lumbar)  Patient Location: PACU  Anesthesia Type:General  Level of Consciousness: awake, alert , oriented and patient cooperative  Airway & Oxygen Therapy: Patient Spontanous Breathing and Patient connected to face mask oxygen  Post-op Assessment: Report given to RN and Post -op Vital signs reviewed and stable  Post vital signs: Reviewed and stable  Last Vitals:  Vitals Value Taken Time  BP 111/58 08/09/20 1509  Temp    Pulse 84 08/09/20 1512  Resp 17 08/09/20 1512  SpO2 98 % 08/09/20 1512  Vitals shown include unvalidated device data.  Last Pain:  Vitals:   08/09/20 1055  TempSrc: Oral  PainSc:       Patients Stated Pain Goal: 2 (08/09/20 1010)  Complications: No complications documented.

## 2020-08-10 MED ORDER — METHOCARBAMOL 500 MG PO TABS: 500.0000 mg | ORAL_TABLET | Freq: Four times a day (QID) | ORAL | 1 refills | Status: AC | PRN

## 2020-08-10 MED ORDER — OXYCODONE HCL 10 MG PO TABS
10.0000 mg | ORAL_TABLET | ORAL | 0 refills | Status: AC | PRN
Start: 2020-08-10 — End: ?

## 2020-08-10 NOTE — Care Management (Signed)
No HH needs, DME to be provided by unit staff.

## 2020-08-10 NOTE — Discharge Summary (Signed)
Physician Discharge Summary  Patient ID: Alejandra White MRN: [id number] DOB/AGE: 1966-01-16 54 y.o.  Admit date: 08/09/2020 Discharge date: 08/10/2020  Admission Diagnoses:  Discharge Diagnoses:  Active Problems:   S/P lumbar fusion   Discharged Condition: good  Hospital Course: Patient mated to the hospital where she underwent uncomplicated lumbar decompression fusion.  Postop Nedra Hai doing very well.  Preoperative back and lower extremity pain much improved.  Standing ambulating and voiding without difficulty.  Ready for discharge home.  Consults:   Significant Diagnostic Studies:   Treatments:   Discharge Exam: Blood pressure (!) 97/55, pulse 62, temperature (!) 97.5 F (36.4 C), temperature source Oral, resp. rate 17, height 5\' 8"  (1.727 m), weight (!) 152.4 kg, SpO2 97 %. Awake and alert.  Oriented and appropriate.  Motor and sensory function intact.  Wound clean and dry.  Chest and abdomen benign.  Disposition: Discharge disposition: 01-Home or Self Care        Allergies as of 08/10/2020      Reactions   Codeine Hives   Penicillins Hives   Hives around injection site       Medication List    TAKE these medications   acetaminophen 500 MG tablet Commonly known as: TYLENOL Take 1,000 mg by mouth in the morning and at bedtime.   cetirizine 10 MG tablet Commonly known as: ZYRTEC Take 10 mg by mouth daily.   diclofenac 75 MG EC tablet Commonly known as: VOLTAREN Take 75 mg by mouth 2 (two) times daily.   escitalopram 20 MG tablet Commonly known as: LEXAPRO Take 20 mg by mouth daily.   gabapentin 300 MG capsule Commonly known as: NEURONTIN Take 600 mg by mouth 3 (three) times daily.   losartan-hydrochlorothiazide 100-25 MG tablet Commonly known as: HYZAAR Take 1 tablet by mouth daily.   lovastatin 10 MG tablet Commonly known as: MEVACOR Take 10 mg by mouth at bedtime.   methocarbamol 500 MG tablet Commonly known as: ROBAXIN Take 1 tablet  (500 mg total) by mouth every 6 (six) hours as needed for muscle spasms.   metoprolol tartrate 100 MG tablet Commonly known as: LOPRESSOR Take 100 mg by mouth daily.   Oxycodone HCl 10 MG Tabs Take 1 tablet (10 mg total) by mouth every 3 (three) hours as needed for severe pain ((score 7 to 10)).            Durable Medical Equipment  (From admission, onward)         Start     Ordered   08/09/20 1721  DME Walker rolling  Once       Question:  Patient needs a walker to treat with the following condition  Answer:  S/P lumbar fusion   08/09/20 1720   08/09/20 1721  DME 3 n 1  Once        08/09/20 1720           Signed: 08/11/20 Wrenn Willcox 08/10/2020, 9:52 AM

## 2020-08-10 NOTE — Progress Notes (Signed)
Patient alert and oriented, mae's well, voiding adequate amount of urine, swallowing without difficulty, no c/o pain at time of discharge. Patient discharged home with family. Script and discharged instructions given to patient. Patient and family stated understanding of instructions given. Patient has an appointment with Dr. Jones °

## 2020-08-10 NOTE — Discharge Instructions (Addendum)

## 2020-08-10 NOTE — Evaluation (Signed)
Occupational Therapy Evaluation Patient Details Name: Alejandra White MRN: [id number] DOB: 10/03/1965 Today's Date: 08/10/2020    History of Present Illness Pt is a 54 y.o. F with significant PMH of prior ACDF who presents with dynamic degenerative spondylolisthesis with spinal stenosis L4-5 who is now s/p L4-5 PLIF, left L5-S1 hemilaminectomy, medial facetctomy, and foraminotomies.   Clinical Impression   Pt PTA: Pt living at home with significant other and reports nearly independent with ADL and mobility. Pt currently, able to perform all ADL tasks with increased time. Pt donning pants without bending, but unable to perform figure 4 cross over leg technique so pt education on a reacher to assist with LB ADL. Pt education on toilet tongs for hygiene. Pt reports to have supportive family to assist as needed.  Back handout provided and reviewed ADL in detail. Pt educated on: clothing between brace, never sleep in brace, set an alarm at night for medication, avoid sitting for long periods of time, correct bed positioning for sleeping, correct sequence for bed mobility, avoiding lifting more than 5 pounds and never wash directly over incision. All education is complete and patient indicates understanding. No further acute OT services required. OT signing off.      Follow Up Recommendations  No OT follow up    Equipment Recommendations  None recommended by OT    Recommendations for Other Services       Precautions / Restrictions Precautions Precautions: Back Precaution Booklet Issued: Yes (comment) Precaution Comments: Pt recalling 3/3 back precautions after PT morning session. Required Braces or Orthoses: Spinal Brace Spinal Brace: Lumbar corset;Applied in sitting position Restrictions Weight Bearing Restrictions: No      Mobility Bed Mobility               General bed mobility comments: OOB in chair    Transfers Overall transfer level: Modified independent                General transfer comment: Increased time/effort    Balance Overall balance assessment: No apparent balance deficits (not formally assessed)                                         ADL either performed or assessed with clinical judgement   ADL Overall ADL's : Modified independent;At baseline                                       General ADL Comments: Pt able to perform all ADL tasks with increased time. Pt donning pants without bending, but unable to perform figure 4 cross over leg technique so pt education on a reacher to assist with LB ADL. Pt education on toilet tongs for hygiene. Pt reports to have supportive family to assist as needed.     Vision Baseline Vision/History: Wears glasses Patient Visual Report: No change from baseline Vision Assessment?: No apparent visual deficits     Perception     Praxis      Pertinent Vitals/Pain Pain Assessment: Faces Faces Pain Scale: Hurts a little bit Pain Location: surgical site Pain Descriptors / Indicators: Operative site guarding Pain Intervention(s): Monitored during session;Premedicated before session;Repositioned     Hand Dominance Right   Extremity/Trunk Assessment Upper Extremity Assessment Upper Extremity Assessment: Overall WFL for tasks assessed;RUE deficits/detail;LUE deficits/detail RUE Deficits /  Details: 5/5 MM grade LUE Deficits / Details: 5/5 MM grade   Lower Extremity Assessment Lower Extremity Assessment: Overall WFL for tasks assessed;Defer to PT evaluation RLE Deficits / Details: Strength 5/5 LLE Deficits / Details: Strength 5/5   Cervical / Trunk Assessment Cervical / Trunk Assessment: Other exceptions Cervical / Trunk Exceptions: s/p PLIF   Communication Communication Communication: No difficulties   Cognition Arousal/Alertness: Awake/alert Behavior During Therapy: WFL for tasks assessed/performed Overall Cognitive Status: Within Functional Limits for  tasks assessed                                     General Comments       Exercises     Shoulder Instructions      Home Living Family/patient expects to be discharged to:: Private residence Living Arrangements: Spouse/significant other Available Help at Discharge: Family (son, sister) Type of Home: House Home Access: Stairs to enter Secretary/administrator of Steps: 3 Entrance Stairs-Rails: None Home Layout: Multi-level Alternate Level Stairs-Number of Steps:  (flight) Alternate Level Stairs-Rails: Right;Left Bathroom Shower/Tub: Producer, television/film/video: Standard     Home Equipment: Toilet riser;Shower seat          Prior Functioning/Environment Level of Independence: Independent        Comments: Works in Warehouse manager work        OT Problem List:        OT Treatment/Interventions:      OT Goals(Current goals can be found in the care plan section) Acute Rehab OT Goals Patient Stated Goal: return to work OT Goal Formulation: All assessment and education complete, DC therapy  OT Frequency:     Barriers to D/C:            Co-evaluation              AM-PAC OT "6 Clicks" Daily Activity     Outcome Measure Help from another person eating meals?: None Help from another person taking care of personal grooming?: None Help from another person toileting, which includes using toliet, bedpan, or urinal?: None Help from another person bathing (including washing, rinsing, drying)?: A Little Help from another person to put on and taking off regular upper body clothing?: None Help from another person to put on and taking off regular lower body clothing?: A Little 6 Click Score: 22   End of Session Equipment Utilized During Treatment: Back brace Nurse Communication: Mobility status  Activity Tolerance: Patient tolerated treatment well Patient left: in chair;with call bell/phone within reach  OT Visit Diagnosis: Unsteadiness on feet  (R26.81);Pain Pain - part of body:  (back)                Time: 0812-0828 OT Time Calculation (min): 16 min Charges:  OT General Charges $OT Visit: 1 Visit OT Evaluation $OT Eval Moderate Complexity: 1 Mod  Flora Lipps, OTR/L Acute Rehabilitation Services Pager: [phone] Office: [phone]   Devanie Galanti C 08/10/2020, 9:32 AM

## 2020-08-10 NOTE — Evaluation (Signed)
Physical Therapy Evaluation and Discharge Patient Details Name: Alejandra White MRN: [id number] DOB: Jul 15, 1966 Today's Date: 08/10/2020   History of Present Illness  Pt is a 54 y.o. F with significant PMH of prior ACDF who presents with dynamic degenerative spondylolisthesis with spinal stenosis L4-5 who is now s/p L4-5 PLIF, left L5-S1 hemilaminectomy, medial facetctomy, and foraminotomies.  Clinical Impression  Patient evaluated by Physical Therapy with no further acute PT needs identified. Prior to admission, pt lives with her significant other and is employed in clerical work. On PT evaluation, pt denies radicular symptoms and presents with good pain control. Ambulating 400 feet with no assistive device and negotiated 10 steps without physical assist. Education provided regarding spinal precautions, generalized walking program, car transfer technique. All education has been completed and the patient has no further questions. No follow-up Physical Therapy or equipment needs. PT is signing off. Thank you for this referral.     Follow Up Recommendations No PT follow up    Equipment Recommendations  None recommended by PT    Recommendations for Other Services       Precautions / Restrictions Precautions Precautions: Back Precaution Booklet Issued: Yes (comment) Precaution Comments: Pt recalling 2/3, written handout provided Required Braces or Orthoses: Spinal Brace Spinal Brace: Lumbar corset;Applied in sitting position Restrictions Weight Bearing Restrictions: No      Mobility  Bed Mobility               General bed mobility comments: OOB in chair    Transfers Overall transfer level: Modified independent               General transfer comment: Increased time/effort  Ambulation/Gait Ambulation/Gait assistance: Modified independent (Device/Increase time) Gait Distance (Feet): 400 Feet Assistive device: None Gait Pattern/deviations: Step-through  pattern;Wide base of support     General Gait Details: No gross instability noted, good posture  Stairs Stairs: Yes Stairs assistance: Min guard Stair Management: One rail Right Number of Stairs: 10 General stair comments: Cues for step by step pattern  Wheelchair Mobility    Modified Rankin (Stroke Patients Only)       Balance Overall balance assessment: No apparent balance deficits (not formally assessed)                                           Pertinent Vitals/Pain Pain Assessment: Faces Faces Pain Scale: Hurts a little bit Pain Location: surgical site Pain Descriptors / Indicators: Operative site guarding Pain Intervention(s): Monitored during session    Home Living Family/patient expects to be discharged to:: Private residence Living Arrangements: Spouse/significant other Available Help at Discharge: Family (son, sister) Type of Home: House Home Access: Stairs to enter Entrance Stairs-Rails: None Entrance Stairs-Number of Steps: 3 Home Layout: Multi-level Home Equipment: Toilet riser;Shower seat      Prior Function Level of Independence: Independent         Comments: Works in Warehouse manager work     Higher education careers adviser        Extremity/Trunk Assessment   Upper Extremity Assessment Upper Extremity Assessment: Defer to OT evaluation    Lower Extremity Assessment Lower Extremity Assessment: RLE deficits/detail;LLE deficits/detail RLE Deficits / Details: Strength 5/5 LLE Deficits / Details: Strength 5/5    Cervical / Trunk Assessment Cervical / Trunk Assessment: Other exceptions Cervical / Trunk Exceptions: s/p PLIF  Communication   Communication: No difficulties  Cognition Arousal/Alertness:  Awake/alert Behavior During Therapy: WFL for tasks assessed/performed Overall Cognitive Status: Within Functional Limits for tasks assessed                                        General Comments      Exercises      Assessment/Plan    PT Assessment Patent does not need any further PT services  PT Problem List         PT Treatment Interventions      PT Goals (Current goals can be found in the Care Plan section)  Acute Rehab PT Goals Patient Stated Goal: return to work PT Goal Formulation: All assessment and education complete, DC therapy    Frequency     Barriers to discharge        Co-evaluation               AM-PAC PT "6 Clicks" Mobility  Outcome Measure Help needed turning from your back to your side while in a flat bed without using bedrails?: None Help needed moving from lying on your back to sitting on the side of a flat bed without using bedrails?: None Help needed moving to and from a bed to a chair (including a wheelchair)?: None Help needed standing up from a chair using your arms (e.g., wheelchair or bedside chair)?: None Help needed to walk in hospital room?: None Help needed climbing 3-5 steps with a railing? : None 6 Click Score: 24    End of Session   Activity Tolerance: Patient tolerated treatment well Patient left: with call bell/phone within reach;in chair Nurse Communication: Mobility status PT Visit Diagnosis: Pain Pain - part of body:  (back)    Time: 0240-9735 PT Time Calculation (min) (ACUTE ONLY): 18 min   Charges:   PT Evaluation $PT Eval Low Complexity: 1 Low          Alejandra White, PT, DPT Acute Rehabilitation Services Pager [phone] Office [phone]   Alejandra White 08/10/2020, 9:16 AM

## 2020-08-12 NOTE — Anesthesia Postprocedure Evaluation (Signed)
Anesthesia Post Note  Patient: Alejandra White  Procedure(s) Performed: POSTERIOR LUMBAR INTERBODY FUSION LUMBAR FOUR-FIVE; LEFT LUMBAR FIVE-SACARAL ONE MICRODISCECTOMY (N/A Spine Lumbar)     Patient location during evaluation: PACU Anesthesia Type: General Level of consciousness: awake and alert Pain management: pain level controlled Vital Signs Assessment: post-procedure vital signs reviewed and stable Respiratory status: spontaneous breathing, nonlabored ventilation, respiratory function stable and patient connected to nasal cannula oxygen Cardiovascular status: blood pressure returned to baseline and stable Postop Assessment: no apparent nausea or vomiting Anesthetic complications: no   No complications documented.  Last Vitals:  Vitals:   08/10/20 0505 08/10/20 0732  BP: 98/67 (!) 97/55  Pulse: 71 62  Resp: 20 17  Temp: 36.7 C (!) 36.4 C  SpO2: 96% 97%    Last Pain:  Vitals:   08/10/20 0732  TempSrc: Oral  PainSc:                  Lauro Manlove L Shambhavi Salley

## 2021-11-12 IMAGING — CR DG CHEST 2V
2 series · 2 of 2 positions shown · non-contrast
Comparison: None.

CLINICAL DATA: Preoperative assessment for lumbar surgery

EXAM:
CHEST - 2 VIEW

[w chest pa]
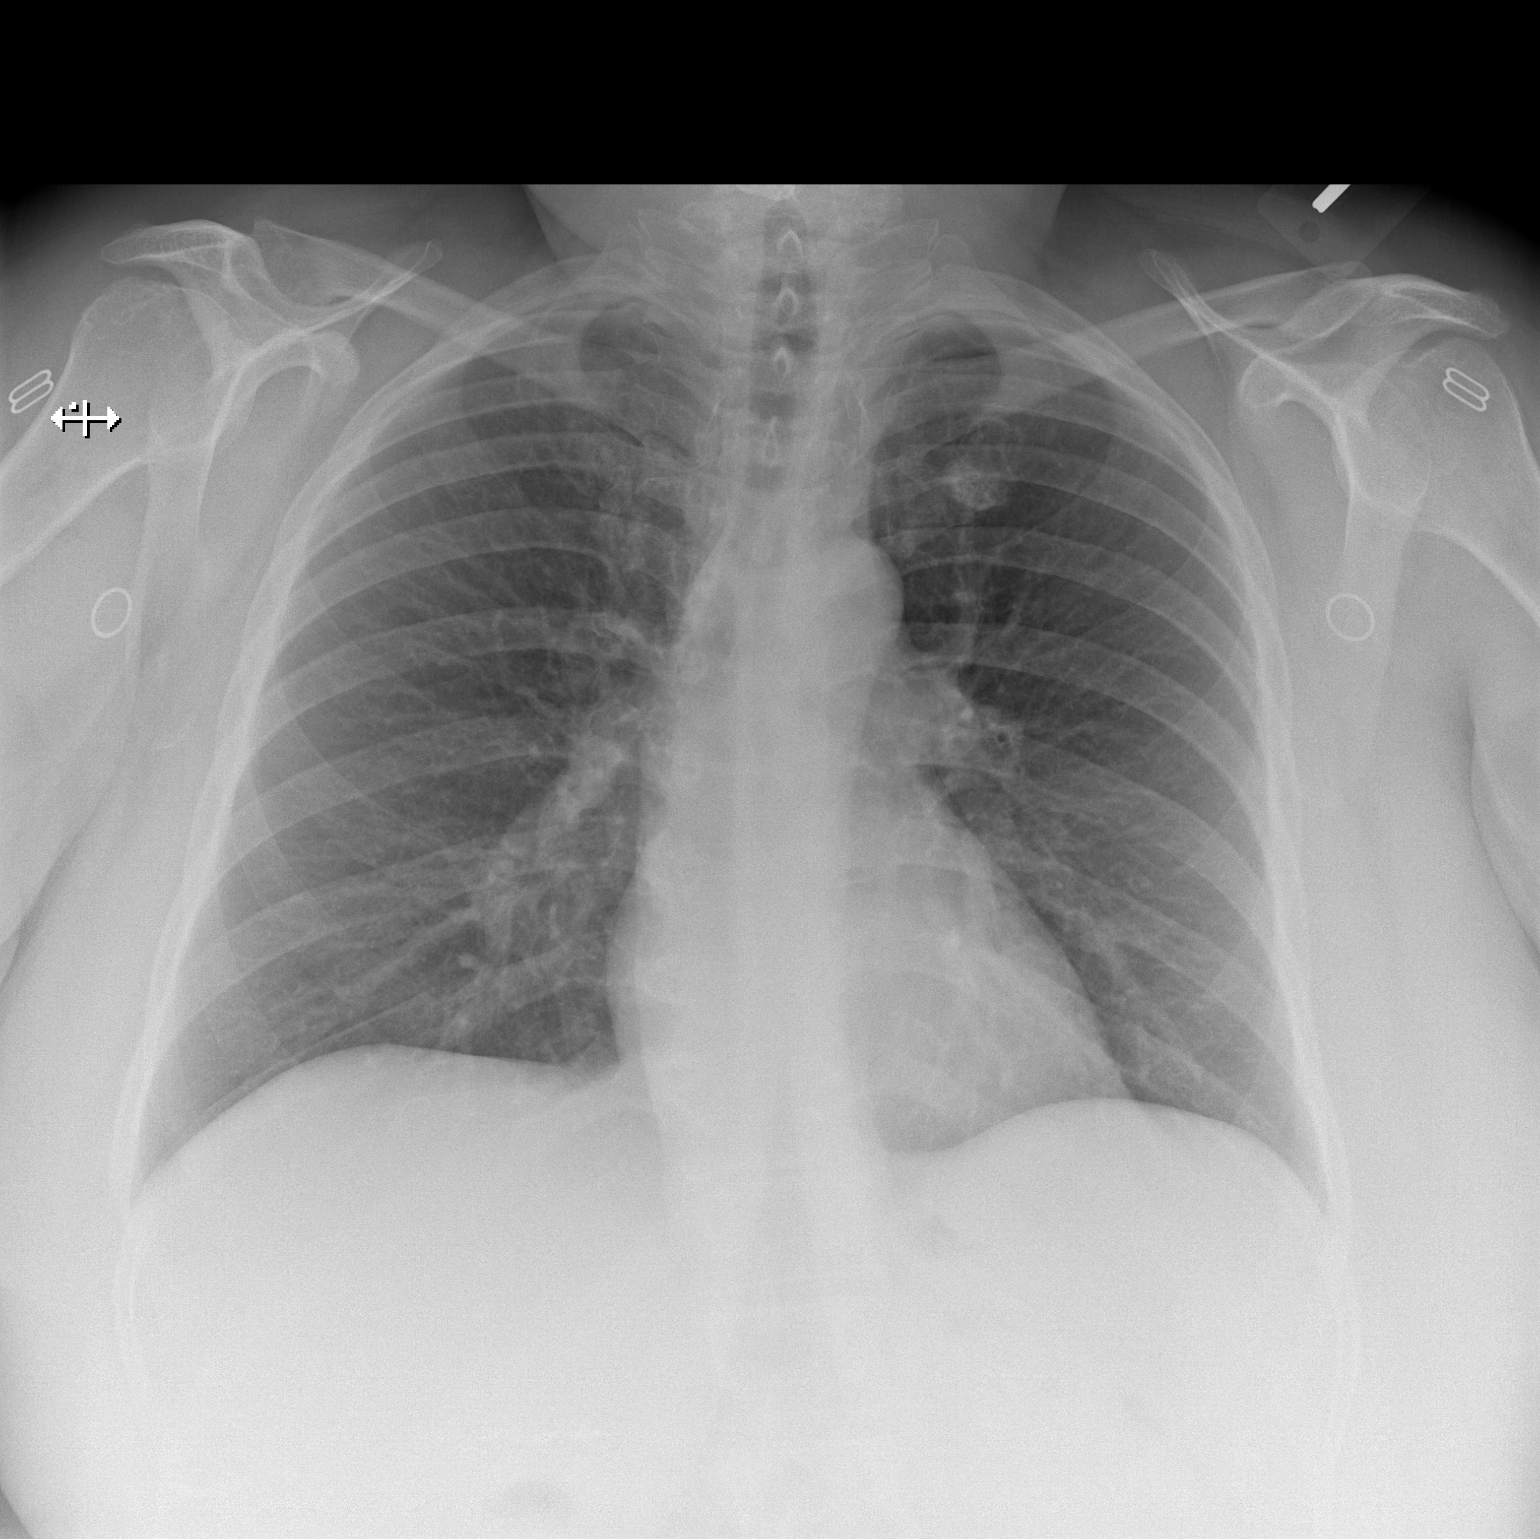

[w chest lat]
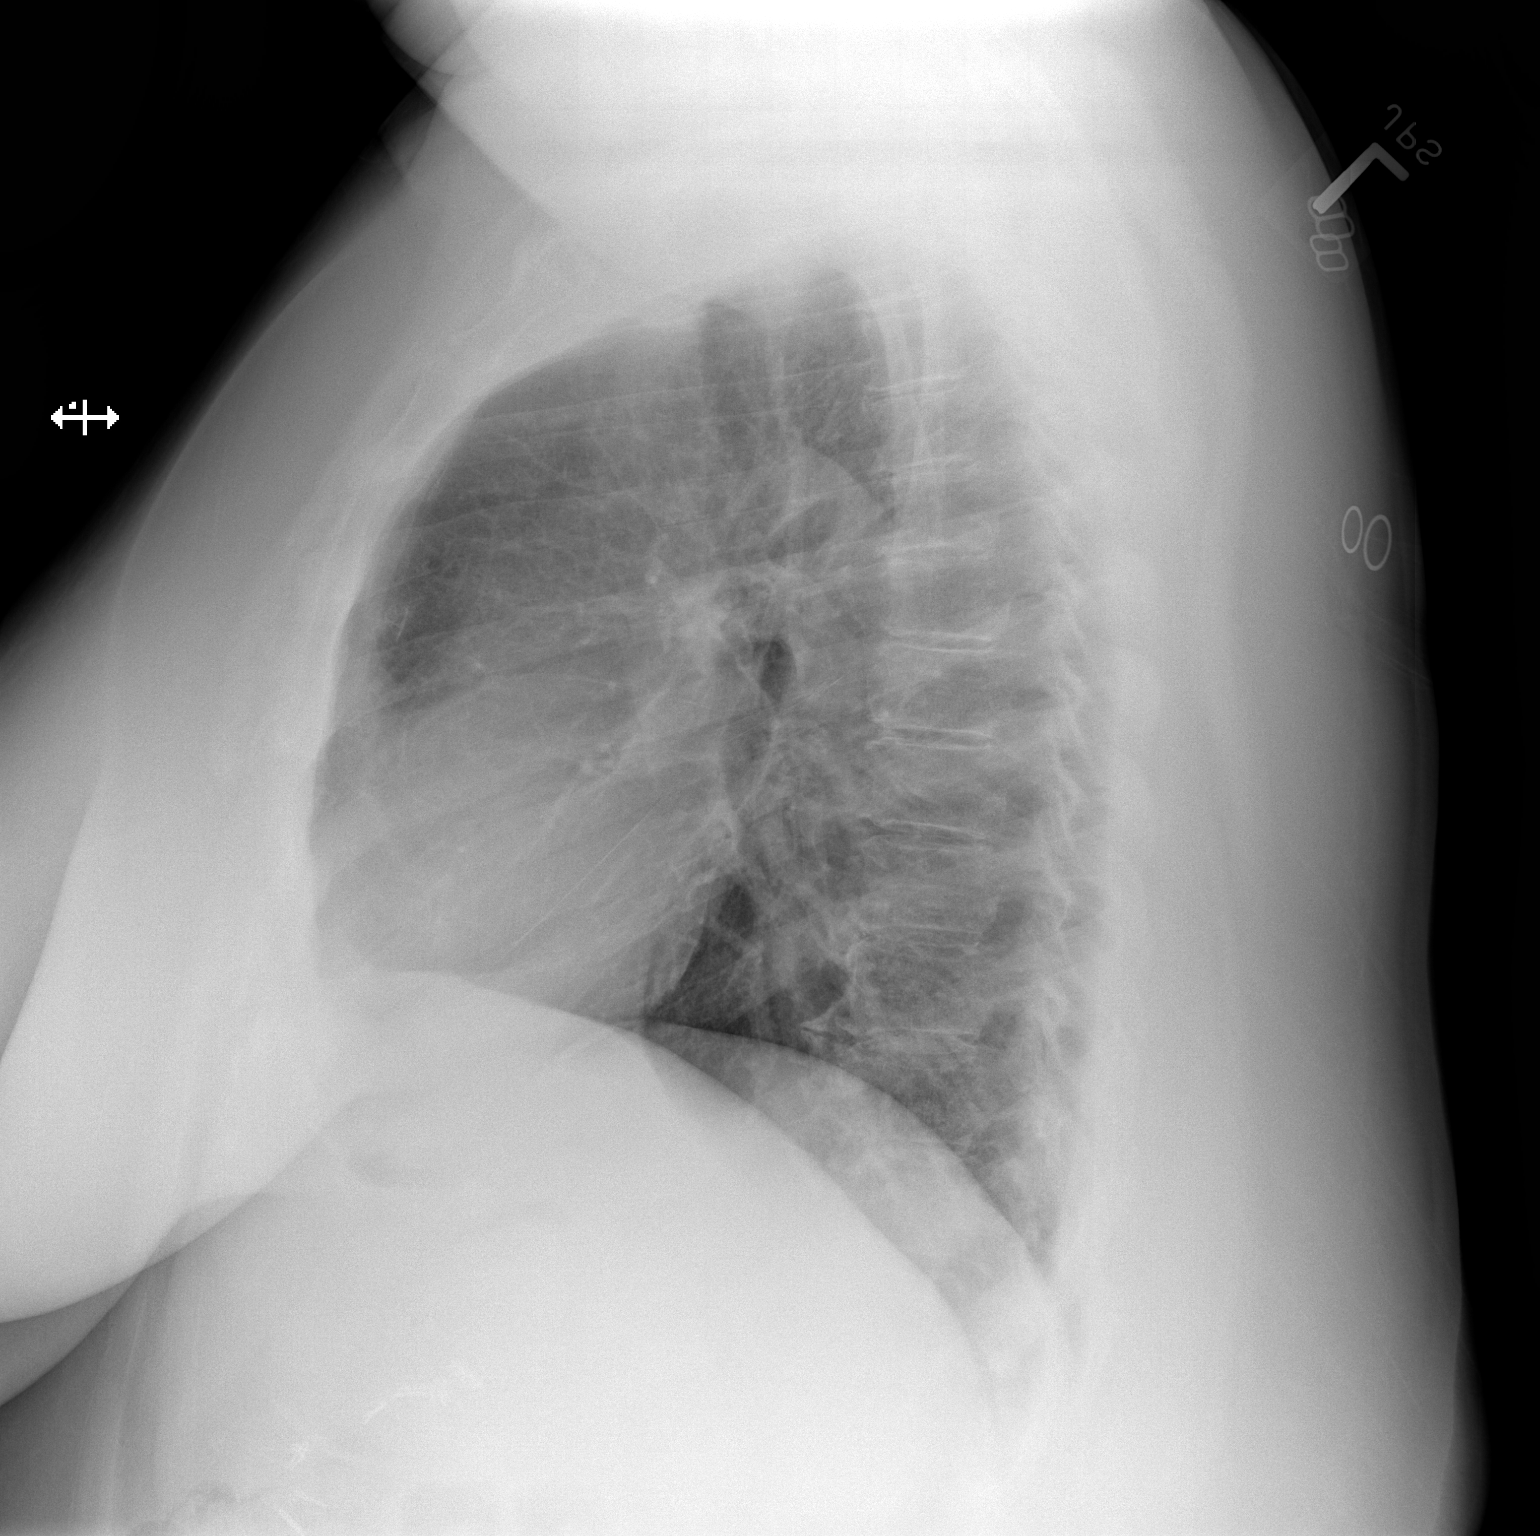

[2 of 2 positions shown; findings below may reference images not displayed]

FINDINGS: Lungs are clear. Heart size and pulmonary vascularity are normal. No
adenopathy. There is lower thoracic dextroscoliosis. There is
postoperative change in the lower cervical spine.
IMPRESSION: Lungs clear.  Cardiac silhouette normal.

## 2021-11-14 IMAGING — RF DG C-ARM 1-60 MIN
1 series · 3 of 3 positions shown · non-contrast
Comparison: Lumbar spine radiographs 07/09/2020. lumbar spine MRI
07/04/2020

CLINICAL DATA: Surgery, elective. Additional history provided: L4-5
PLIF, L5-S1 micro discectomy. Provided fluoroscopy time 1 minutes,
49 seconds (153.51 mGy).

EXAM:
LUMBAR SPINE - 2-3 VIEW; DG C-ARM 1-60 MIN

[Series 1: run · 3 of 3 slices shown]
[im 1/3]
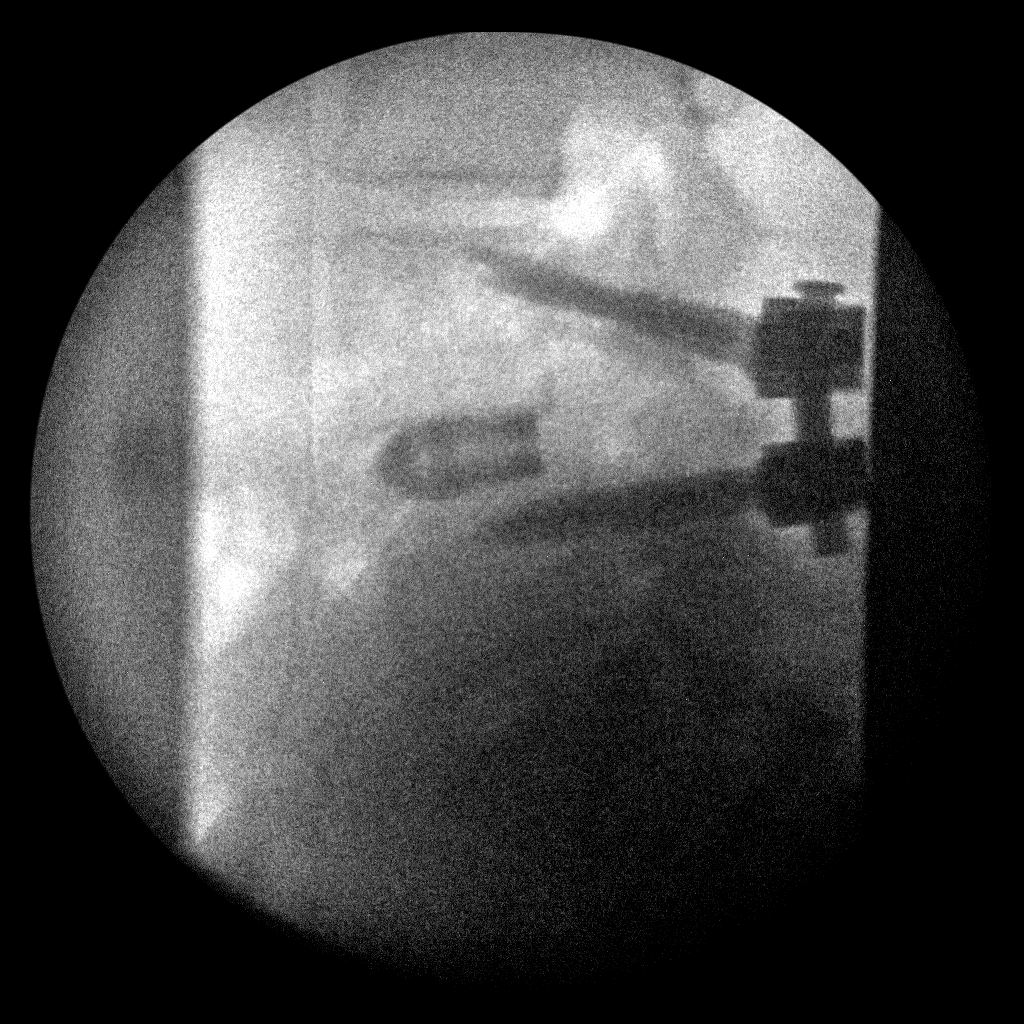
[im 2/3]
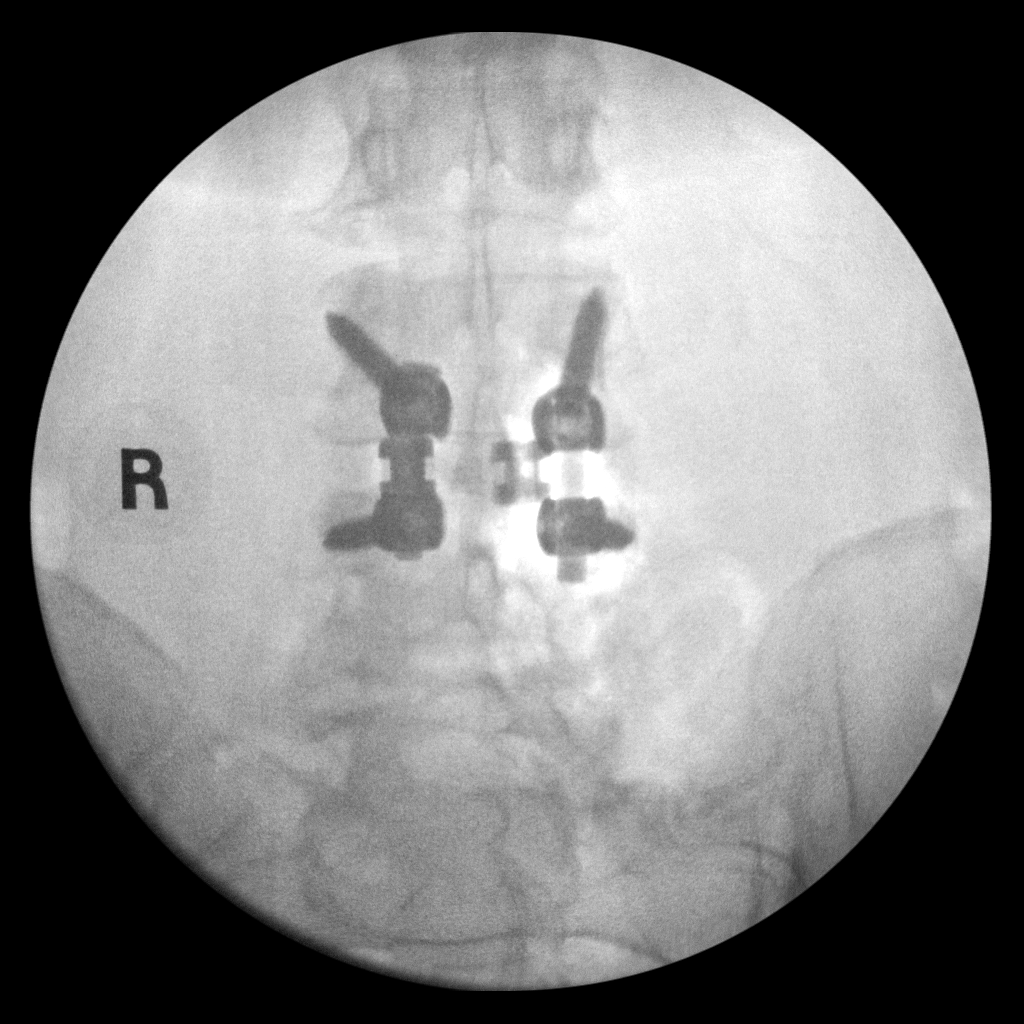
[im 3/3]
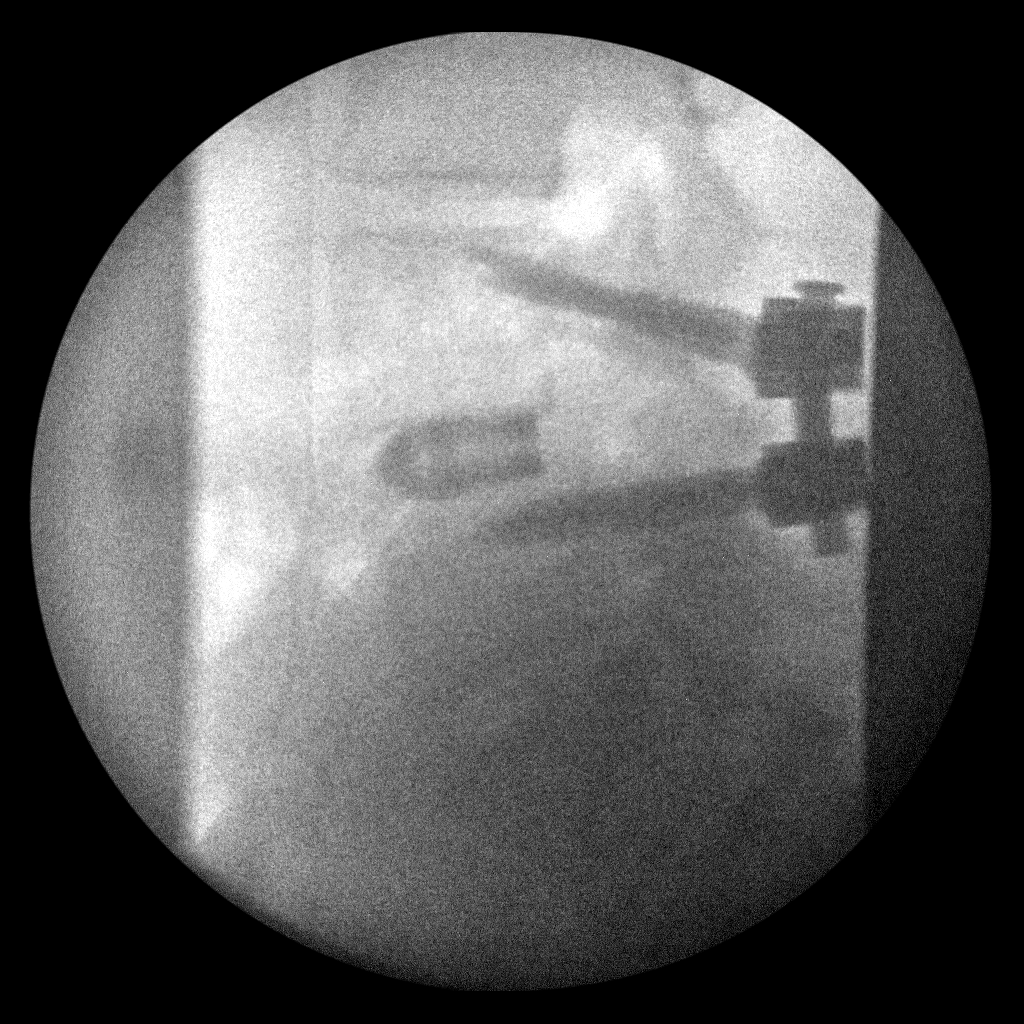

[3 of 3 positions shown; findings below may reference images not displayed]

FINDINGS: PA and lateral view intraoperative fluoroscopic images of the lumbar
spine are submitted, 2 images total. Transitional lumbosacral
anatomy is noted on prior examinations with partial lumbarization of
the S1 vertebra. The provided fluoroscopic images demonstrate a
posterior spinal fusion construct (bilateral pedicle screws and
vertical interconnecting rods), as well as an interbody device, at
L4-L5.
IMPRESSION: Two intraoperative fluoroscopic images of the lumbar spine, as
described.
